# Patient Record
Sex: Female | Born: 1995 | Race: Black or African American | Hispanic: No | Marital: Single | Smoking: Never smoker
Health system: Southern US, Community
[De-identification: ages and names within clinical notes are randomized; demographics above are authoritative.]

---

## 2019-10-13 ENCOUNTER — Emergency Department
Admission: EM | Admit: 2019-10-13 | Discharge: 2019-10-14 | Disposition: A | Discharge status: Home / Self Care | Payer: Medicaid Other | Attending: Emergency Medicine | Admitting: Emergency Medicine

## 2019-10-13 ENCOUNTER — Emergency Department: Payer: Medicaid Other

## 2019-10-13 ENCOUNTER — Encounter: Payer: Self-pay | Admitting: Emergency Medicine

## 2019-10-13 ENCOUNTER — Other Ambulatory Visit: Payer: Self-pay

## 2019-10-13 DIAGNOSIS — S060X0A Concussion without loss of consciousness, initial encounter: Secondary | ICD-10-CM | POA: Diagnosis not present

## 2019-10-13 DIAGNOSIS — Y929 Unspecified place or not applicable: Secondary | ICD-10-CM | POA: Insufficient documentation

## 2019-10-13 DIAGNOSIS — Y939 Activity, unspecified: Secondary | ICD-10-CM | POA: Insufficient documentation

## 2019-10-13 DIAGNOSIS — Y999 Unspecified external cause status: Secondary | ICD-10-CM | POA: Diagnosis not present

## 2019-10-13 DIAGNOSIS — S0990XA Unspecified injury of head, initial encounter: Secondary | ICD-10-CM | POA: Diagnosis present

## 2019-10-13 LAB — CBC
HCT: 35.5 % — ABNORMAL LOW (ref 36.0–46.0)
Hemoglobin: 12 g/dL (ref 12.0–15.0)
MCH: 30.9 pg (ref 26.0–34.0)
MCHC: 33.8 g/dL (ref 30.0–36.0)
MCV: 91.5 fL (ref 80.0–100.0)
Platelets: 241 10*3/uL (ref 150–400)
RBC: 3.88 MIL/uL (ref 3.87–5.11)
RDW: 11.7 % (ref 11.5–15.5)
WBC: 9.3 10*3/uL (ref 4.0–10.5)
nRBC: 0 % (ref 0.0–0.2)

## 2019-10-13 LAB — URINALYSIS, COMPLETE (UACMP) WITH MICROSCOPIC
Bilirubin Urine: NEGATIVE
Glucose, UA: NEGATIVE mg/dL
Hgb urine dipstick: NEGATIVE
Ketones, ur: 20 mg/dL — AB
Nitrite: NEGATIVE
Protein, ur: 100 mg/dL — AB
Specific Gravity, Urine: 1.029 (ref 1.005–1.030)
pH: 5 (ref 5.0–8.0)

## 2019-10-13 LAB — BASIC METABOLIC PANEL
Anion gap: 8 (ref 5–15)
BUN: 17 mg/dL (ref 6–20)
CO2: 24 mmol/L (ref 22–32)
Calcium: 9.3 mg/dL (ref 8.9–10.3)
Chloride: 106 mmol/L (ref 98–111)
Creatinine, Ser: 0.75 mg/dL (ref 0.44–1.00)
GFR calc Af Amer: >60 mL/min (ref >60)
GFR calc non Af Amer: >60 mL/min (ref >60)
Glucose, Bld: 86 mg/dL (ref 70–99)
Potassium: 3.7 mmol/L (ref 3.5–5.1)
Sodium: 138 mmol/L (ref 135–145)

## 2019-10-13 LAB — POCT PREGNANCY, URINE: Preg Test, Ur: NEGATIVE
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
## 2019-10-13 NOTE — ED Notes (Signed)
IV start attempt x 2 

## 2019-10-13 NOTE — ED Notes (Signed)
IV team at bedside 

## 2019-10-14 ENCOUNTER — Encounter: Payer: Self-pay | Admitting: Radiology

## 2019-10-14 ENCOUNTER — Emergency Department: Payer: Medicaid Other

## 2019-10-14 MED ORDER — ACETAMINOPHEN 325 MG PO TABS
650.0000 mg | ORAL_TABLET | Freq: Once | ORAL | Status: AC
  Administered 2019-10-14: 650 mg via ORAL
  Filled 2019-10-14: qty 2

## 2019-10-14 MED ORDER — IBUPROFEN 600 MG PO TABS: 600.0000 mg | ORAL_TABLET | Freq: Four times a day (QID) | ORAL | 0 refills | Status: AC | PRN

## 2019-10-14 MED ORDER — IOHEXOL 350 MG/ML SOLN
75.0000 mL | Freq: Once | INTRAVENOUS | Status: AC | PRN
  Administered 2019-10-14: 75 mL via INTRAVENOUS
  Filled 2019-10-14: qty 75

## 2019-10-15 LAB — URINE CULTURE
